# Patient Record
Sex: Male | Born: 1970 | Hispanic: No | Marital: Married | Smoking: Current every day smoker
Health system: Southern US, Community
[De-identification: ages and names within clinical notes are randomized; demographics above are authoritative.]

---

## 2008-04-21 ENCOUNTER — Ambulatory Visit: Payer: Self-pay | Admitting: Family Medicine

## 2008-05-10 ENCOUNTER — Ambulatory Visit: Payer: Self-pay | Admitting: Internal Medicine

## 2010-01-04 ENCOUNTER — Emergency Department (HOSPITAL_COMMUNITY): Admission: EM | Admit: 2010-01-04 | Discharge: 2010-01-04 | Payer: Self-pay | Admitting: Family Medicine

## 2011-06-27 ENCOUNTER — Inpatient Hospital Stay (HOSPITAL_COMMUNITY)
Admission: RE | Admit: 2011-06-27 | Discharge: 2011-06-27 | Disposition: A | Discharge status: Home / Self Care | Payer: Self-pay | Source: Ambulatory Visit | Attending: Family Medicine | Admitting: Family Medicine

## 2011-06-28 ENCOUNTER — Emergency Department (HOSPITAL_COMMUNITY)
Admission: EM | Admit: 2011-06-28 | Discharge: 2011-06-28 | Disposition: A | Discharge status: Home / Self Care | Payer: No Typology Code available for payment source | Attending: Emergency Medicine | Admitting: Emergency Medicine

## 2011-06-28 DIAGNOSIS — R109 Unspecified abdominal pain: Secondary | ICD-10-CM | POA: Insufficient documentation

## 2011-06-28 DIAGNOSIS — R51 Headache: Secondary | ICD-10-CM | POA: Insufficient documentation

## 2011-06-28 DIAGNOSIS — M549 Dorsalgia, unspecified: Secondary | ICD-10-CM | POA: Insufficient documentation

## 2011-06-28 DIAGNOSIS — IMO0001 Reserved for inherently not codable concepts without codable children: Secondary | ICD-10-CM | POA: Insufficient documentation

## 2011-06-28 DIAGNOSIS — R110 Nausea: Secondary | ICD-10-CM | POA: Insufficient documentation

## 2011-06-28 DIAGNOSIS — R252 Cramp and spasm: Secondary | ICD-10-CM | POA: Insufficient documentation

## 2011-06-28 DIAGNOSIS — R509 Fever, unspecified: Secondary | ICD-10-CM | POA: Insufficient documentation

## 2011-06-28 LAB — URINALYSIS, ROUTINE W REFLEX MICROSCOPIC
Bilirubin Urine: NEGATIVE
Glucose, UA: 250 mg/dL — AB
Ketones, ur: NEGATIVE mg/dL
Leukocytes, UA: NEGATIVE
pH: 6 (ref 5.0–8.0)

## 2011-06-28 LAB — COMPREHENSIVE METABOLIC PANEL
ALT: 42 U/L (ref 0–53)
Albumin: 3.5 g/dL (ref 3.5–5.2)
Alkaline Phosphatase: 73 U/L (ref 39–117)
Potassium: 4.1 mEq/L (ref 3.5–5.1)
Sodium: 136 mEq/L (ref 135–145)
Total Protein: 7.4 g/dL (ref 6.0–8.3)

## 2011-06-28 LAB — DIFFERENTIAL
Basophils Absolute: 0 10*3/uL (ref 0.0–0.1)
Basophils Relative: 1 % (ref 0–1)
Eosinophils Relative: 7 % — ABNORMAL HIGH (ref 0–5)
Monocytes Absolute: 0.8 10*3/uL (ref 0.1–1.0)

## 2011-06-28 LAB — URINE MICROSCOPIC-ADD ON

## 2011-06-28 LAB — CBC
MCHC: 35.6 g/dL (ref 30.0–36.0)
RDW: 12.3 % (ref 11.5–15.5)

## 2013-10-12 ENCOUNTER — Ambulatory Visit: Payer: Self-pay | Admitting: Physician Assistant

## 2013-10-12 VITALS — BP 104/75 | HR 80 | Temp 98.6°F | Resp 18 | Ht 66.0 in | Wt 131.0 lb

## 2013-10-12 DIAGNOSIS — R067 Sneezing: Secondary | ICD-10-CM

## 2013-10-12 DIAGNOSIS — K5900 Constipation, unspecified: Secondary | ICD-10-CM

## 2013-10-12 DIAGNOSIS — R112 Nausea with vomiting, unspecified: Secondary | ICD-10-CM

## 2013-10-12 DIAGNOSIS — R6889 Other general symptoms and signs: Secondary | ICD-10-CM

## 2013-10-12 MED ORDER — LORATADINE 10 MG PO TABS: 10.0000 mg | ORAL_TABLET | Freq: Every day | ORAL | Status: DC

## 2013-10-12 MED ORDER — RANITIDINE HCL 150 MG PO TABS: 150.0000 mg | ORAL_TABLET | Freq: Two times a day (BID) | ORAL | Status: DC

## 2013-10-12 MED ORDER — PROMETHAZINE HCL 25 MG PO TABS: 25.0000 mg | ORAL_TABLET | Freq: Three times a day (TID) | ORAL | Status: DC | PRN

## 2013-10-12 NOTE — Patient Instructions (Addendum)
It is important to stay well hydrated.  Things that often make reflux symptoms worse: Caffeine Carbonation (soda) Spicy foods Acidic foods (like tomato sauce, orange juice, lemonade) Fatty foods (including whole milk and ice cream) Stress (feeling sad, worried, nervous) Nicotine Alcohol Non-steroidal anti-inflammatories (NSAIDS, like ibuprofen and naproxen. Acetaminophen/Tylenol is OK)  To help reduce constipation and promote bowel health, 1. Drink at least 64 ounces of water each day; 2. Eat plenty of fiber (fruits, vegetables, whole grains, legumes) 3. Get plenty of physical activity  If needed, use a stool softener (docusate) or an osmotic laxative (like Miralax) each day, or as needed.  For the sneezing associated with the showers, please try the loratadine each day.

## 2013-10-12 NOTE — Progress Notes (Signed)
  Subjective:    Patient ID: Brian Horne, male    DOB: 03/12/1971, 42 y.o.   MRN: [id number]  HPI  This 42 y.o. male presents for evaluation of nausea and vomiting, and dizziness. 3 episodes in the past year.  Symptoms last 1-2 days each episode.   This past Saturday (10/10/2013) was the worst.  He had not had much sleep the night before, and had then had an energy drink.  No otyher triggers identified, and he doesn't remember what he ate/drank prior to the previous two episodes. He awoke from sleep, at about midnight, with nausea and vomiting. Felt dizzy. Rapid position changes make the dizziness worse. Thinks it may be related to his blood pressure.  No diarrhea. No fever or chills.  No heartburn or indigestion.  No cough. He reports very small, hard BMs requiring straining as typical.  He is accompanied today by his wife and one daughter, who translates.  Review of Systems As above. Complains of daily HA and sneezing after showers. ROS is other wise negative.    Objective:   Physical Exam  Blood pressure 104/75, pulse 80, temperature 98.6 F (37 C), temperature source Oral, resp. rate 18, height 5\' 6"  (1.676 m), weight 131 lb (59.421 kg). Body mass index is 21.15 kg/(m^2). Well-developed, well nourished Burmese man who is awake, alert and oriented, in NAD. He does develop dizziness with rapid position change during examination today. HEENT: [place]/AT, PERRL, EOMI.  Sclera and conjunctiva are clear.  EAC are patent, TMs are normal in appearance. Nasal mucosa is pink and moist. OP is clear. Neck: supple, non-tender, no lymphadenopathy, thyromegaly. Heart: RRR, no murmur Lungs: normal effort, CTA Abdomen: normo-active bowel sounds, supple, no mass or organomegaly. Mild tenderness is the LUQ. Extremities: no cyanosis, clubbing or edema. Skin: warm and dry without rash. Psychologic: good mood and appropriate affect, normal speech and behavior.       Assessment & Plan:  Nausea & vomiting -  Likely these are episodes of reflux.  Counseled regarding triggers. Plan: ranitidine (ZANTAC) 150 MG tablet, promethazine (PHENERGAN) 25 MG tablet  Constipation - Plan: encouraged fluids, fiber and physical activity.  Miralax PRN.  Sneezing - Unusual association with showers.  Possibly warmth of the shower causes some nasal drainage and that triggers the sneezing. Plan: loratadine (CLARITIN) 10 MG tablet  Fernande Bras, PA-C Physician Assistant-Certified [facility]

## 2013-10-13 ENCOUNTER — Encounter: Payer: Self-pay | Admitting: Physician Assistant

## 2014-02-02 ENCOUNTER — Emergency Department (HOSPITAL_COMMUNITY)
Admission: EM | Admit: 2014-02-02 | Discharge: 2014-02-03 | Disposition: A | Discharge status: Home / Self Care | Payer: Medicaid Other | Attending: Emergency Medicine | Admitting: Emergency Medicine

## 2014-02-02 ENCOUNTER — Encounter (HOSPITAL_COMMUNITY): Payer: Self-pay | Admitting: Emergency Medicine

## 2014-02-02 DIAGNOSIS — Z79899 Other long term (current) drug therapy: Secondary | ICD-10-CM | POA: Insufficient documentation

## 2014-02-02 DIAGNOSIS — F17200 Nicotine dependence, unspecified, uncomplicated: Secondary | ICD-10-CM | POA: Insufficient documentation

## 2014-02-02 DIAGNOSIS — J069 Acute upper respiratory infection, unspecified: Secondary | ICD-10-CM | POA: Insufficient documentation

## 2014-02-02 NOTE — ED Notes (Signed)
Pt. reports right upper back pain for 1 month , denies injury or fall , occasional productive cough / no fever or chills.

## 2014-02-03 ENCOUNTER — Emergency Department (HOSPITAL_COMMUNITY): Payer: Medicaid Other

## 2014-02-03 MED ORDER — ALBUTEROL SULFATE HFA 108 (90 BASE) MCG/ACT IN AERS
INHALATION_SPRAY | RESPIRATORY_TRACT | Status: AC
  Administered 2014-02-03: 03:00:00
  Filled 2014-02-03: qty 6.7

## 2014-02-03 MED ORDER — AZITHROMYCIN 250 MG PO TABS
ORAL_TABLET | ORAL | Status: AC
  Filled 2014-02-03: qty 2

## 2014-02-03 MED ORDER — HYDROCODONE-ACETAMINOPHEN 5-325 MG PO TABS: 1.0000 | ORAL_TABLET | ORAL | Status: AC | PRN

## 2014-02-03 MED ORDER — AZITHROMYCIN 250 MG PO TABS: 250.0000 mg | ORAL_TABLET | Freq: Every day | ORAL | Status: DC

## 2014-02-03 NOTE — ED Provider Notes (Signed)
Medical screening examination/treatment/procedure(s) were performed by non-physician practitioner and as supervising physician I was immediately available for consultation/collaboration.    Damondre Pfeifle, MD 02/03/14 0709 

## 2014-02-03 NOTE — ED Provider Notes (Signed)
CSN: [id number]     Arrival date & time 02/02/14  2247 History   First MD Initiated Contact with Patient 02/03/14 0006     Chief Complaint  Patient presents with  . Back Pain     (Consider location/radiation/quality/duration/timing/severity/associated sxs/prior Treatment) HPI  Patient presents to the ER with complaints of upper back pain and a cough for the past month. He recently started a new job that he denies being more physical then his previous job. He denies having any fevers or chills but admits to have some congestion. He was encouraged to come by a friend. His pain is worse in his right shoulder when he coughs. His pain is not increased with ROM or use of the arm. He has tried 500 mg Naprosyn at home, 8 times according the friend, but it did not help his pain or coughing. Patient says he is otherwise healthy. He does not have any immunizations as [place] is from [place]. He denies having recent weight loss or decreased energy.  History reviewed. No pertinent past medical history. History reviewed. No pertinent past surgical history. No family history on file. History  Substance Use Topics  . Smoking status: Current Every Day Smoker  . Smokeless tobacco: Never Used  . Alcohol Use: Yes    Review of Systems The patient denies anorexia, fever, weight loss,, vision loss, decreased hearing, hoarseness, chest pain, syncope, dyspnea on exertion, peripheral edema, balance deficits, hemoptysis, abdominal pain, melena, hematochezia, severe indigestion/heartburn, hematuria, incontinence, genital sores, muscle weakness, suspicious skin lesions, transient blindness, difficulty walking, depression, unusual weight change, abnormal bleeding, enlarged lymph nodes, angioedema, and breast masses.    Allergies  Review of patient's allergies indicates no known allergies.  Home Medications   Current Outpatient Rx  Name  Route  Sig  Dispense  Refill  . azithromycin (ZITHROMAX) 250 MG  tablet   Oral   Take 1 tablet (250 mg total) by mouth daily. Once daily until finished.   4 tablet   0   . HYDROcodone-acetaminophen (NORCO/VICODIN) 5-325 MG per tablet   Oral   Take 1-2 tablets by mouth every 4 (four) hours as needed.   20 tablet   0   . loratadine (CLARITIN) 10 MG tablet   Oral   Take 1 tablet (10 mg total) by mouth daily.   30 tablet   11   . promethazine (PHENERGAN) 25 MG tablet   Oral   Take 1 tablet (25 mg total) by mouth every 8 (eight) hours as needed for nausea.   20 tablet   0   . ranitidine (ZANTAC) 150 MG tablet   Oral   Take 1 tablet (150 mg total) by mouth 2 (two) times daily.   60 tablet   0    BP 121/81  Pulse 61  Temp(Src) 97.7 F (36.5 C) (Oral)  Resp 16  SpO2 100% Physical Exam  Nursing note and vitals reviewed. Constitutional: He appears well-developed and well-nourished. No distress.  HENT:  Head: Normocephalic and atraumatic.  Eyes: Pupils are equal, round, and reactive to light.  Neck: Normal range of motion. Neck supple.  Cardiovascular: Normal rate and regular rhythm.   Pulmonary/Chest: Effort normal. No respiratory distress. He has wheezes (coughing during exam). He exhibits no tenderness.  Abdominal: Soft.  Musculoskeletal:       Right shoulder: He exhibits normal range of motion, no tenderness, no bony tenderness, no swelling, no laceration, no pain, no spasm and normal pulse.  Pt has worsened pain  during coughing.  Neurological: He is alert.  Skin: Skin is warm and dry.    ED Course  Procedures (including critical care time) Labs Review Labs Reviewed - No data to display Imaging Review Dg Chest 2 View  02/03/2014   CLINICAL DATA:  Cough  EXAM: CHEST  2 VIEW  COMPARISON:  None.  FINDINGS: The heart size and mediastinal contours are within normal limits. There is no focal infiltrate, pulmonary edema, or pleural effusion. The visualized skeletal structures are unremarkable.  IMPRESSION: No active cardiopulmonary  disease.   Electronically Signed   By: Sherian Rein M.D.   On: 02/03/2014 02:15    EKG Interpretation   None       MDM   Final diagnoses:  URI (upper respiratory infection)    Patients symptoms are consistent with UTI.  His chest xray does not show signs of TB nor are his symptoms consistent with this.  He has no pneumothorax, his vital signs are stable, he is not in distress and does not look ill.  Will try initially treating with Azithromycin and giving Albuterol inhaler in ED.  Rx: Azithromycin and Vicodin. He is to use inhaler at home  Referral given for a PCP for follow-up.  43 y.o.Brian Horne's evaluation in the Emergency Department is complete. It has been determined that no acute conditions requiring further emergency intervention are present at this time. The patient/guardian have been advised of the diagnosis and plan. We have discussed signs and symptoms that warrant return to the ED, such as changes or worsening in symptoms.  Vital signs are stable at discharge. Filed Vitals:   02/03/14 0238  BP: 121/81  Pulse: 61  Temp: 97.7 F (36.5 C)  Resp: 16    Patient/guardian has voiced understanding and agreed to follow-up with the PCP or specialist.    Dorthula Matas, PA-C 02/03/14 0309

## 2014-02-10 ENCOUNTER — Emergency Department (HOSPITAL_COMMUNITY): Payer: Medicaid Other

## 2014-02-10 ENCOUNTER — Emergency Department (HOSPITAL_COMMUNITY)
Admission: EM | Admit: 2014-02-10 | Discharge: 2014-02-10 | Disposition: A | Discharge status: Home / Self Care | Payer: Medicaid Other | Attending: Emergency Medicine | Admitting: Emergency Medicine

## 2014-02-10 ENCOUNTER — Encounter (HOSPITAL_COMMUNITY): Payer: Self-pay | Admitting: Emergency Medicine

## 2014-02-10 DIAGNOSIS — M5412 Radiculopathy, cervical region: Secondary | ICD-10-CM

## 2014-02-10 DIAGNOSIS — Z79899 Other long term (current) drug therapy: Secondary | ICD-10-CM | POA: Insufficient documentation

## 2014-02-10 DIAGNOSIS — M6281 Muscle weakness (generalized): Secondary | ICD-10-CM | POA: Insufficient documentation

## 2014-02-10 DIAGNOSIS — F17200 Nicotine dependence, unspecified, uncomplicated: Secondary | ICD-10-CM | POA: Insufficient documentation

## 2014-02-10 DIAGNOSIS — R209 Unspecified disturbances of skin sensation: Secondary | ICD-10-CM | POA: Insufficient documentation

## 2014-02-10 DIAGNOSIS — M546 Pain in thoracic spine: Secondary | ICD-10-CM | POA: Insufficient documentation

## 2014-02-10 MED ORDER — KETOROLAC TROMETHAMINE 30 MG/ML IJ SOLN
30.0000 mg | Freq: Once | INTRAMUSCULAR | Status: AC
  Administered 2014-02-10: 30 mg via INTRAMUSCULAR
  Filled 2014-02-10: qty 1

## 2014-02-10 MED ORDER — OXYCODONE-ACETAMINOPHEN 5-325 MG PO TABS
1.0000 | ORAL_TABLET | Freq: Once | ORAL | Status: AC
  Administered 2014-02-10: 1 via ORAL
  Filled 2014-02-10: qty 1

## 2014-02-10 MED ORDER — HYDROCODONE-ACETAMINOPHEN 5-325 MG PO TABS: 1.0000 | ORAL_TABLET | ORAL | Status: AC | PRN

## 2014-02-10 MED ORDER — PREDNISONE 20 MG PO TABS: 40.0000 mg | ORAL_TABLET | Freq: Every day | ORAL | Status: AC

## 2014-02-10 MED ORDER — CYCLOBENZAPRINE HCL 10 MG PO TABS: 10.0000 mg | ORAL_TABLET | Freq: Every day | ORAL | Status: AC

## 2014-02-10 NOTE — Discharge Instructions (Signed)
Call an Dr. Roda ShuttersXu for further evaluation of your neck pain. Call for a follow up appointment with a Family or Primary Care Provider.  Return if Symptoms worsen.   Take medication as prescribed.    Emergency Department Resource Guide 1) Find a Doctor and Pay Out of Pocket Although you won't have to find out who is covered by your insurance plan, it is a good idea to ask around and get recommendations. You will then need to call the office and see if the doctor you have chosen will accept you as a new patient and what types of options they offer for patients who are self-pay. Some doctors offer discounts or will set up payment plans for their patients who do not have insurance, but you will need to ask so you aren't surprised when you get to your appointment.  2) Contact Your Local Health Department Not all health departments have doctors that can see patients for sick visits, but many do, so it is worth a call to see if yours does. If you don't know where your local health department is, you can check in your phone book. The CDC also has a tool to help you locate your state's health department, and many state websites also have listings of all of their local health departments.  3) Find a Walk-in Clinic If your illness is not likely to be very severe or complicated, you may want to try a walk in clinic. These are popping up all over the country in pharmacies, drugstores, and shopping centers. They're usually staffed by nurse practitioners or physician assistants that have been trained to treat common illnesses and complaints. They're usually fairly quick and inexpensive. However, if you have serious medical issues or chronic medical problems, these are probably not your best option.  No Primary Care Doctor: - Call Health Connect at  [phone] - they can help you locate a primary care doctor that  accepts your insurance, provides certain services, etc. - Physician Referral Service-  [phone]  Chronic Pain Problems: Organization         Address  Phone   Notes  [facility]  [phone] Patients need to be referred by their primary care doctor.   Medication Assistance: Organization         Address  Phone   Notes  [facility] [address] [place], [place] [address] [phone] --Must be a resident of [place] [facility] [place] -- Must have NO insurance coverage whatsoever (no Medicaid/ Medicare, etc.) -- The pt. MUST have a primary care doctor that directs their care regularly and follows them in the community   MedAssist  [phone]   [employer]  [phone]    Agencies that provide inexpensive medical care: Organization         Address  Phone   Notes  [facility]  [phone]   [facility]    [phone]   [facility] [address] [place], [place] [address] [phone]   [facility] [address], [place] [phone]   Planned Parenthood    [phone]   [facility]    [phone]   [facility]  [address], [place] Phone:  [phone], Fax:  [phone] Hours of Operation:  9 am - 6 pm, M-F.  Also accepts Medicaid/Medicare and self-pay.  [place]  Center for [place], [address], [place] Phone: [phone], Fax: [phone]. Hours of Operation:  8:30 am - 5:30 pm, M-F.  Also accepts Medicaid and self-pay.  [facility] [place] [address] [place] Phone: [phone]   [place], [place], [place] [phone], Ext. [phone] Mondays & Thursdays: 7-9 AM.  First 15 patients are seen on a first come, first serve basis.    [place] Providers:  Organization         Address  Phone   Notes  [facility] [address],  [place] [phone] Also accepts self-pay patients.  [facility] [id number] [place], [place]  [phone]   [place], [address], [place] [phone]   [facility] [address] [place] [phone]   Lucianne Lei [address], [place]   [phone] Only accepts [place] Access [place] patients after they have their name applied to their card.   Self-Pay (no insurance) in [facility]:  Organization         Address  Phone   Notes  Sickle Cell Patients, [facility] [place] [phone]   [facility] Urgent Care [place] [phone]   [facility] [place]  [place], [place], [place] [phone]   [facility]/Dr. Osei-Bonsu  [address], [place] or [address], [place] [phone] Phone number for both [place] and [place] locations is the same.  [facility] [address] [place] [phone]   [facility] [address], [place] or [address] [phone]   [facility] [address], [place] [phone], phone; [phone], fax Sees patients 1st and 3rd Saturday of every month.  Must not qualify for public or private insurance (i.e. Medicaid, Medicare, [facility] Health Choice, Veterans' Benefits)  Household income should be no more than 200% of the poverty level The clinic cannot treat you if you are pregnant or think you are pregnant  Sexually transmitted diseases are not treated at the clinic.    Dental Care: Organization         Address  Phone  Notes  [facility] Department of [facility] [place] [phone] Accepts children up to age 102 who are enrolled in  [place] or [place]; pregnant women with a Medicaid card; and children who have applied for Medicaid or [place] Health Choice, but were declined, whose parents can pay a reduced fee at time of service.  [facility] Department of [facility]  [address], [place] [phone] Accepts children up to age 74 who are enrolled in [place] or [place]; pregnant women with a Medicaid card; and children who have applied for Medicaid or [place] Health Choice, but were declined, whose parents can pay a reduced fee at time of service.  [facility] Access PRO[facility]  [place] [phone] Patients are seen by appointment only. Walk-ins are not accepted. [place] will see patients 40 years of age and older. Monday - Tuesday (8am-5pm) Most Wednesdays (8:30-5pm) $30 per visit, cash  only  [facility] Adult [facility] Access PROGRAM  [address], [facility] [phone] Patients are seen by appointment only. Walk-ins are not accepted. [place] will see patients 14 years of age and older. One Wednesday Evening (Monthly: Volunteer Based).  $30 per visit, cash only  [place]  [phone] for adults; Children under age 48, call Graduate Pediatric Dentistry at [phone]. Children aged 39-14, please call [phone] to request a pediatric application.  Dental services are provided in all areas of dental care including fillings, crowns and bridges, complete and partial dentures, implants, gum treatment, root canals, and extractions. Preventive care is also provided. Treatment is provided to both adults and children. Patients are selected via a lottery and there is often a waiting list.   [facility] [address] [place]  [phone] [email or web address]   [facility] [address] [place], [place] [phone], Ext. [phone] Second and Fourth Thursday of each month, opens at 6:30  AM; Clinic ends at 9 AM.  Patients are seen on a first-come first-served basis, and a limited number are seen during each clinic.   [facility]  [address] Hillard Danker [place], [place] [phone]   Eligibility Requirements You must have lived in [place], [place], or [place] counties for at least the last three months.   You cannot be eligible for state or federal sponsored [employer], including [employer], [place], or [employer].   You generally cannot be eligible for healthcare insurance through your employer.    How to apply: Eligibility screenings are held every Tuesday and Wednesday afternoon from 1:00 pm until 4:00 pm. You do not need an appointment for the interview!  [facility] [address], [place], [place]   [place]  [place] Department  [place]  [phone]    Behavioral Health Resources in the Community: Intensive Outpatient Programs Organization         Address  Phone  Notes  [place]. [address], [place], [place] [phone]   [facility] Outpatient [address] [place], [place]   ADS: Alcohol & Drug Svcs [address], [place], [place]   [place] [address],  [place], [place] or [phone]   Substance Abuse Resources Organization         Address  Phone  Notes  Alcohol and Drug Services  [phone]   [place]  [phone]   The [place]   Chinita Pester  [phone]   Residential & Outpatient Substance Abuse Program  [phone]   Psychological Services Organization         Address  Phone  Notes  [facility] [place]  [place]  [phone]   [place] [address], [place] or  [phone]    Mobile Crisis Teams Organization         Address  Phone  Notes  [facility], Mobile Crisis Care Unit  [phone]   [facility]  [address] [place], [place]   [place] Levels [address] [place] [phone]    Self-Help/Support Groups Organization         Address  Phone  Notes  [facility]oc[facility] of [place] - variety of support groups  [place] Call for more information  Narcotics Anonymous (NA), [facility]aring [facility] [address], [employer] [place]  2 meetings at this location   Special educational needs teacher         Address  Phone  Notes  ASAP Residential Treatment [place],    [place]  [phone]   [facility]  [address] [place] [id number], [place], [place]   [place], [place] [phone] Admissions: 8am-3pm M-F  Incentives Substance [place] [address]    [place], [place] [phone]   [facility] [address] [place], [place], [place]   [facility] [address],  [place], [place]   [facility]ight Programs - Intensive Outpatient [address], [place], [place]   [facility] ([place] [address].,  [place], [place] [phone] or [phone]   [facility] Services (RTS) [address] [place], [place] Accepts Medicaid  [facility] [place] [address],  [place] [phone] Substance Abuse/Addiction Treatment   [facility] The Organization         Address  Phone  Notes  [facility]  [phone]   Domenic Schwab, PhD [address] Arlis [place], [place]   [phone] or [phone]   [place] and [place], [place] [phone]   [facility] [address],  [place], [place] [phone] Insurance/Medicaid/sponsorship through [facility] and Families [address], [address] [place]                                    [place], [place] [phone] [place] [address], [place] [phone]    Dr. Adele Schilder  [phone]   [facility] Dept. 1) [address], [place] 2) [place] 3)  [place] [address], [place] [phone]  [phone]   [place] [phone] or [phone] (After Hours)

## 2014-02-10 NOTE — ED Notes (Signed)
Pt does not speak english. Per family member, pt was treated for previous URI, was given medication for a week. Pt has had right upper back and shoulder pain for the last month, daughter states its the worst its been today. States it hurts worse when laying down and sometimes its difficult to breath. Pt has bruising all over right shoulder and bruising lines down mid back due to "scratching", pt is burmese and this scratching and bruising is a cultural ritual. Pt has equal lung sounds on both sides, cap refill <3 seconds on both arms, pulses strong.

## 2014-02-10 NOTE — ED Notes (Signed)
Pt in xray

## 2014-02-10 NOTE — ED Notes (Signed)
Pt lying on floor-screaming in pain. taken back to room.

## 2014-02-10 NOTE — ED Provider Notes (Signed)
CSN: [id number]     Arrival date & time 02/10/14  1351 History   First MD Initiated Contact with Patient 02/10/14 1537     Chief Complaint  Patient presents with  . Shoulder Pain  . Back Pain     (Consider location/radiation/quality/duration/timing/severity/associated sxs/prior Treatment) HPI Comments: Brian Horne is a 43 y.o. year-old male with a past medical history of tobacco abuse, presenting the Emergency Department with a chief complaint of neck and shoulder pain for one month.  The patient reports gradual onset of worsening Right shoulder pain with radiation to neck and right forearm.  Aggravating factors include movement of neck more with extension than flexion.  He reports taking Norco with partial relief of pain.  He reports he has also tried deep tissue massage.  She reports ecchymosis to the left and right shoulders, upper back and chest are due to the massage. Denies history of injury to the neck or shoulder.   Patient is a 43 y.o. male presenting with shoulder pain and back pain. The history is provided by the patient, medical records, a significant other and a relative. A language interpreter was used.  Shoulder Pain Associated symptoms include arthralgias, neck pain, numbness and weakness. Pertinent negatives include no chest pain, chills, fever, joint swelling or rash.  Back Pain Associated symptoms: numbness and weakness   Associated symptoms: no chest pain and no fever     History reviewed. No pertinent past medical history. History reviewed. No pertinent past surgical history. No family history on file. History  Substance Use Topics  . Smoking status: Current Every Day Smoker  . Smokeless tobacco: Never Used  . Alcohol Use: Yes    Review of Systems  Constitutional: Negative for fever and chills.  Respiratory: Negative for shortness of breath and wheezing.   Cardiovascular: Negative for chest pain.  Musculoskeletal: Positive for arthralgias, back pain, neck pain and  neck stiffness. Negative for gait problem and joint swelling.  Skin: Positive for color change. Negative for rash.  Neurological: Positive for weakness and numbness.      Allergies  Review of patient's allergies indicates no known allergies.  Home Medications   Current Outpatient Rx  Name  Route  Sig  Dispense  Refill  . albuterol (PROVENTIL HFA;VENTOLIN HFA) 108 (90 BASE) MCG/ACT inhaler   Inhalation   Inhale 1-2 puffs into the lungs every 6 (six) hours as needed for wheezing or shortness of breath.         Marland Kitchen HYDROcodone-acetaminophen (NORCO/VICODIN) 5-325 MG per tablet   Oral   Take 1-2 tablets by mouth every 4 (four) hours as needed.   20 tablet   0    BP 108/62  Pulse 74  Temp(Src) 97.8 F (36.6 C) (Oral)  Resp 14  SpO2 99% Physical Exam  Nursing note and vitals reviewed. Constitutional: He is oriented to person, place, and time. He appears well-developed and well-nourished. No distress.  HENT:  Head: Normocephalic and atraumatic.  Eyes: EOM are normal. Pupils are equal, round, and reactive to light. No scleral icterus.  Neck: Normal range of motion. Neck supple.  Cardiovascular: Normal rate, regular rhythm and normal heart sounds.   No murmur heard. Pulses:      Radial pulses are 2+ on the right side, and 2+ on the left side.  Pulmonary/Chest: Effort normal and breath sounds normal. No respiratory distress. He has no wheezes. He has no rales. He exhibits no tenderness.  Abdominal: Soft. Bowel sounds are normal. There is no  tenderness. There is no rebound and no guarding.  Musculoskeletal: Normal range of motion. He exhibits no edema.       Right shoulder: He exhibits normal range of motion.       Cervical back: He exhibits tenderness. He exhibits no spasm.       Arms: Healing Ecchymosis to left and right shoulders, trapezius, and paravertebral skin.  Full strength in bilateral upper extremities. Positive right Spurling test  Neurological: He is alert and  oriented to person, place, and time. No cranial nerve deficit or sensory deficit. He exhibits normal muscle tone. GCS eye subscore is 4. GCS verbal subscore is 5. GCS motor subscore is 6.  Reflex Scores:      Bicep reflexes are 0 on the right side and 2+ on the left side. Skin: Skin is warm and dry. No rash noted. He is not diaphoretic.  Psychiatric: He has a normal mood and affect. His behavior is normal.    ED Course  Procedures (including critical care time) Labs Review Labs Reviewed - No data to display Imaging Review DG Cervical Spine Complete (Final result)  Result time: 02/10/14 18:23:21    Final result by Rad Results In Interface (02/10/14 18:23:21)    Narrative:   CLINICAL DATA: Neck pain with right shoulder pain  EXAM: CERVICAL SPINE 4+ VIEWS  COMPARISON: None.  FINDINGS: Mild cervical scoliosis. Negative for fracture or mass. Disc spaces are maintained and there is no foraminal encroachment.  IMPRESSION: No acute abnormality.   Electronically Signed By: Marlan Palau M.D. On: 02/10/2014 18:23             DG Shoulder Right (Final result)  Result time: 02/10/14 18:23:28    Final result by Rad Results In Interface (02/10/14 18:23:28)    Narrative:   CLINICAL DATA: Right shoulder pain.  EXAM: RIGHT SHOULDER - 2+ VIEW  COMPARISON: None.  FINDINGS: No acute bony or joint abnormality is identified. Acromioclavicular degenerative change is noted. Image right lung and ribs appear normal.  IMPRESSION: No acute finding.  Mild to moderate acromioclavicular osteoarthritis.   Electronically Signed By: Drusilla Kanner M.D. On: 02/10/2014 18:23      EKG Interpretation   None       MDM   Final diagnoses:  Cervical radicular pain   Pt with a history of right shoulder and neck pain, non-traumatic. Will XR to evaluate for degenerative changes.   Patient reports partial relief of symptoms with Norco.  Will give patient toradol and  re-evaluate. XR- shows AC osteoarthritis. C-spine FINDINGS: Mild cervical scoliosis. Negative for fracture or mass. Disc spaces are maintained and there is no foraminal encroachment. Pain likely cervical radicular pain.   Discussed imaging results, and treatment plan with the patient. Return precautions given. Reports understanding and no other concerns at this time.  Patient is stable for discharge at this time.   Meds given in ED:  Medications  oxyCODONE-acetaminophen (PERCOCET/ROXICET) 5-325 MG per tablet 1 tablet (1 tablet Oral Given 02/10/14 1703)  ketorolac (TORADOL) 30 MG/ML injection 30 mg (30 mg Intramuscular Given 02/10/14 1827)    Discharge Medication List as of 02/10/2014  8:00 PM    START taking these medications   Details  cyclobenzaprine (FLEXERIL) 10 MG tablet Take 1 tablet (10 mg total) by mouth at bedtime., Starting 02/10/2014, Until Discontinued, Print    !! HYDROcodone-acetaminophen (NORCO/VICODIN) 5-325 MG per tablet Take 1 tablet by mouth every 4 (four) hours as needed. Take with meals, Starting 02/10/2014, Until Discontinued, Print  predniSONE (DELTASONE) 20 MG tablet Take 2 tablets (40 mg total) by mouth daily. Take 40 mg by mouth daily for 3 days, then 20mg  by mouth daily for 3 days, then 10mg  daily for 3 days, Starting 02/10/2014, Until Discontinued, Print     !! - Potential duplicate medications found. Please discuss with provider.        Clabe SealLauren M Ruthvik Barnaby, PA-C 02/12/14 2053

## 2014-02-10 NOTE — ED Notes (Signed)
Pt reports right shoulder pain and upper right sided back pain. Pt in severe pain. Seen previously for same, but unsure diagnosis. Denies any injury.

## 2014-02-10 NOTE — ED Notes (Signed)
Pt returned from xray

## 2014-02-14 NOTE — ED Provider Notes (Signed)
Medical screening examination/treatment/procedure(s) were performed by non-physician practitioner and as supervising physician I was immediately available for consultation/collaboration.   Brian ChurnJohn David Catrinia Racicot III, MD 02/14/14 [phone]

## 2015-01-12 IMAGING — CR DG CERVICAL SPINE COMPLETE 4+V
6 series · 6 of 6 positions shown · non-contrast
Comparison: None.

CLINICAL DATA: Neck pain with right shoulder pain

EXAM:
CERVICAL SPINE  4+ VIEWS

[w cervical spine lat]
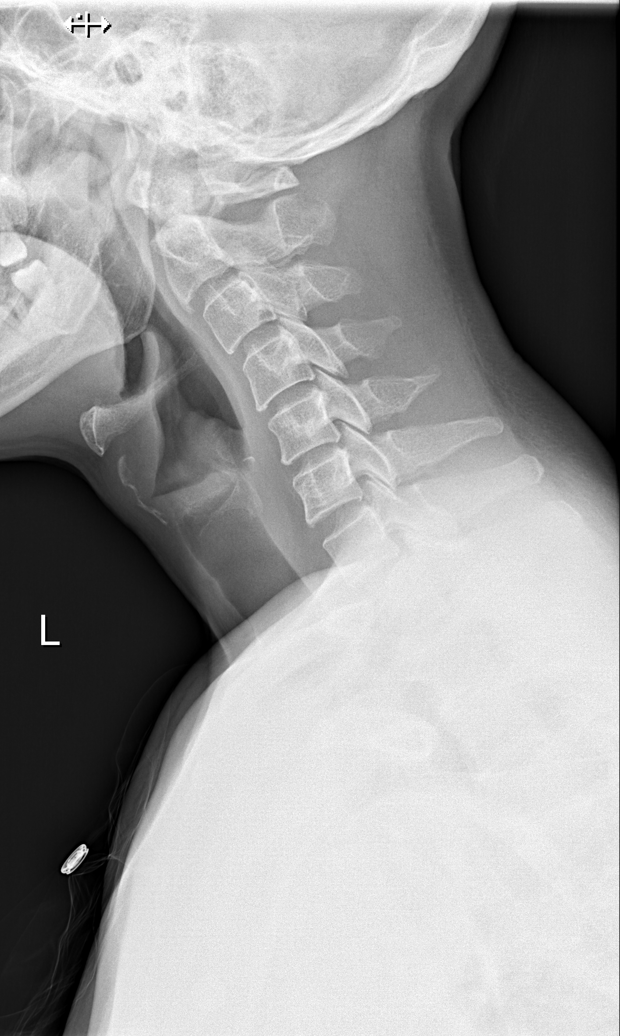

[w cervical swimmers]
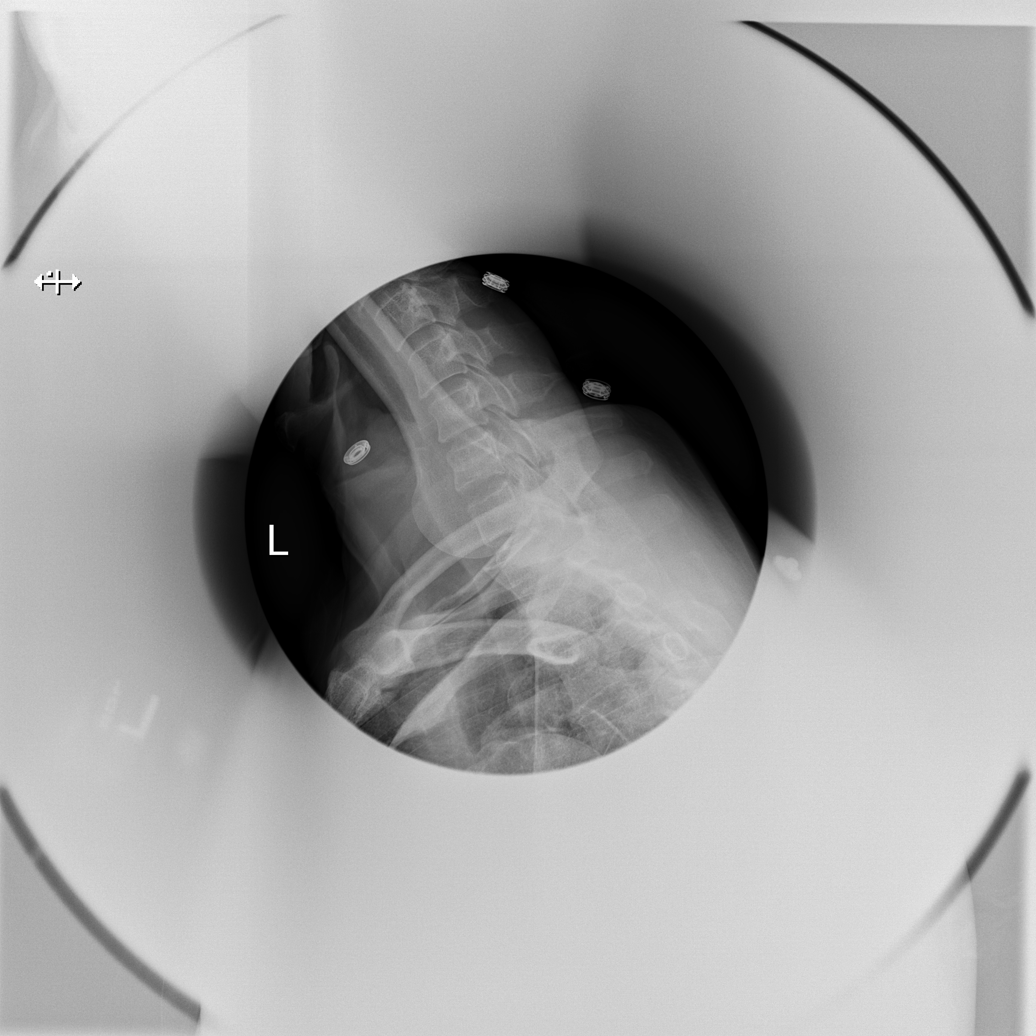

[w cervical spine ap_obl (1 of 2)]
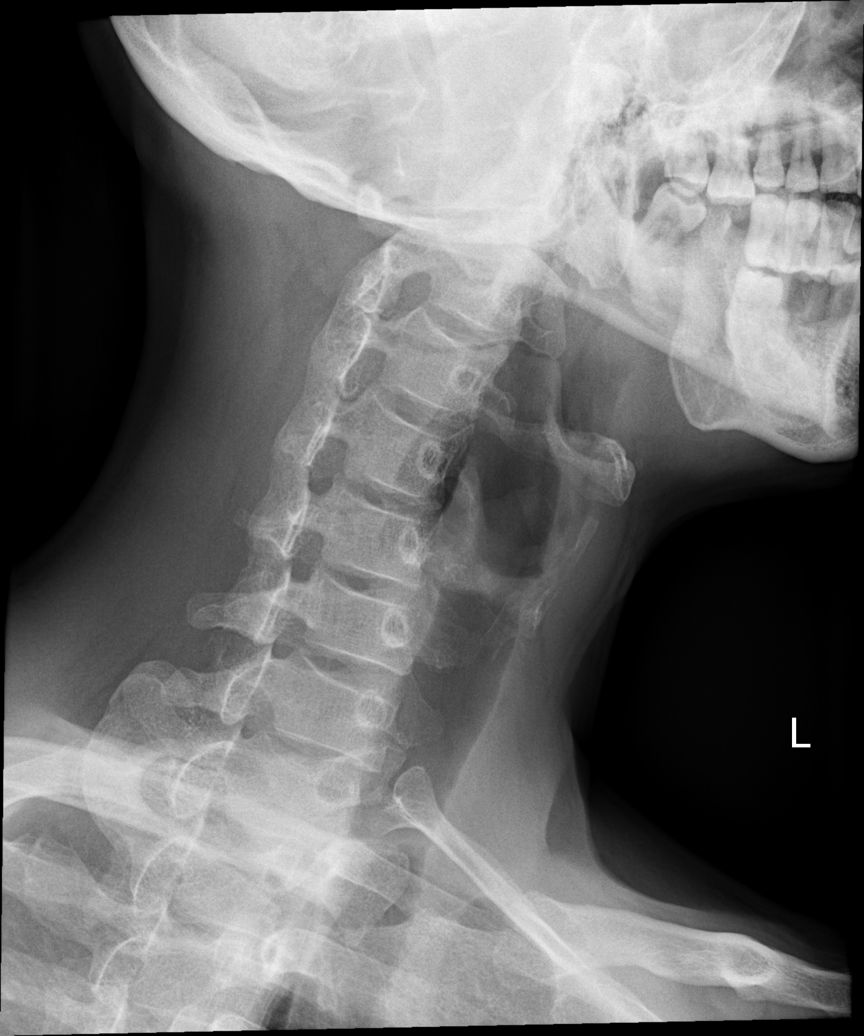

[w cervical spine ap_obl (2 of 2)]
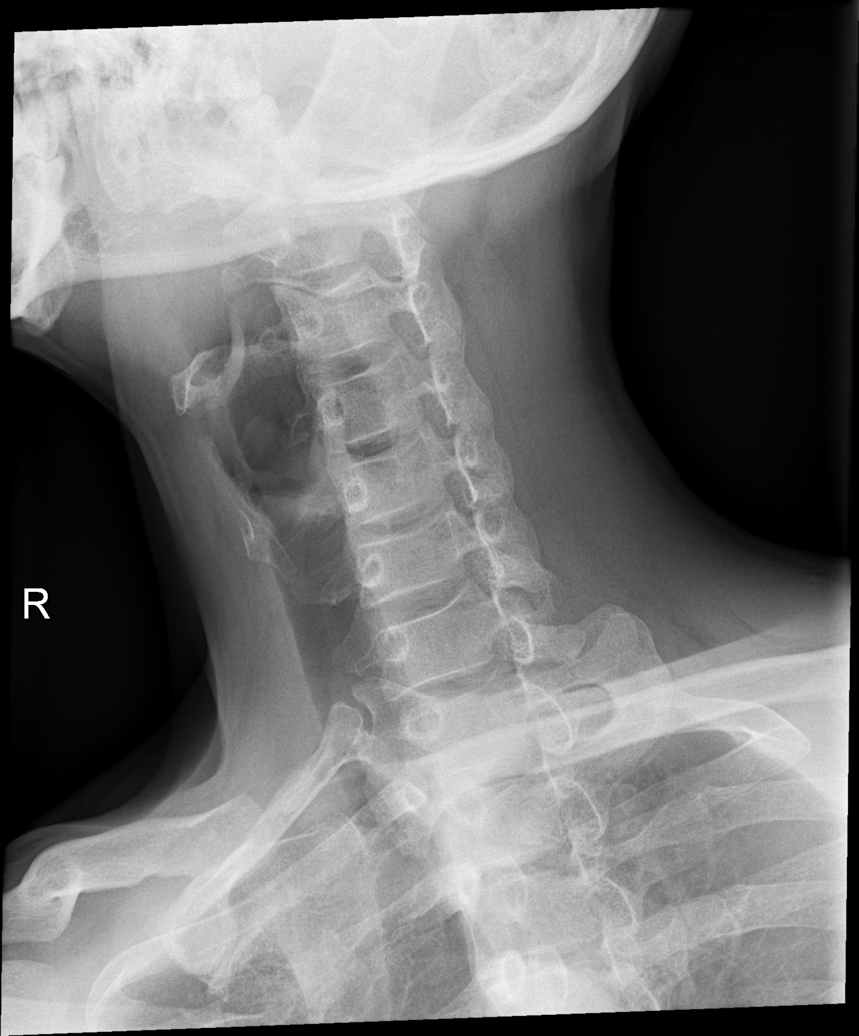

[w cervical spine ap]
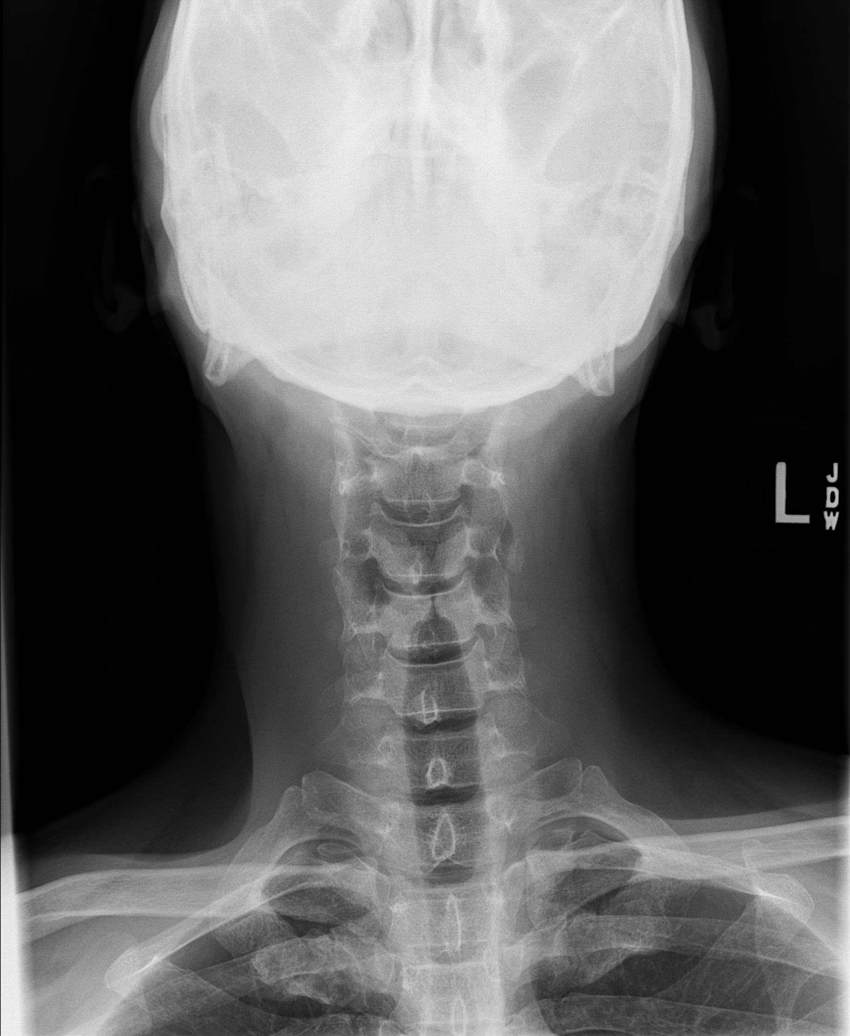

[w cervical spine odontoid]
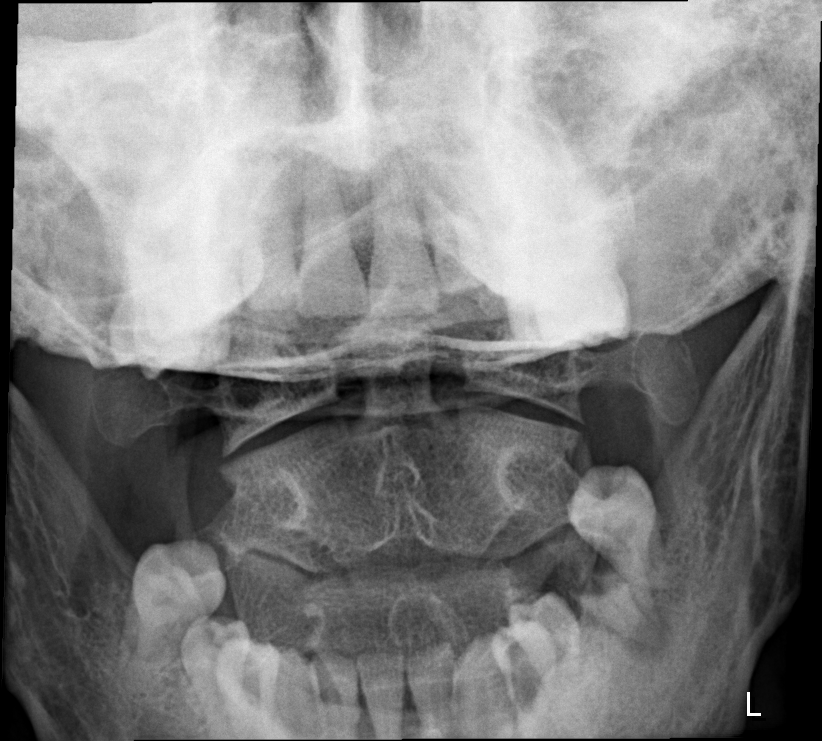

[6 of 6 positions shown; findings below may reference images not displayed]

FINDINGS: Mild cervical scoliosis. Negative for fracture or mass. Disc spaces
are maintained and there is no foraminal encroachment.
IMPRESSION: No acute abnormality.
